# Patient Record
Sex: Female | Born: 1990 | Race: White | Hispanic: No | Marital: Married | Smoking: Current every day smoker
Health system: Southern US, Community
[De-identification: ages and names within clinical notes are randomized; demographics above are authoritative.]

## PROBLEM LIST (undated history)

## (undated) DIAGNOSIS — J189 Pneumonia, unspecified organism: Secondary | ICD-10-CM

## (undated) DIAGNOSIS — R011 Cardiac murmur, unspecified: Secondary | ICD-10-CM

## (undated) DIAGNOSIS — D649 Anemia, unspecified: Secondary | ICD-10-CM

## (undated) HISTORY — PX: HAND SURGERY: SHX662

---
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
## 2014-01-19 ENCOUNTER — Emergency Department (HOSPITAL_COMMUNITY)
Admission: EM | Admit: 2014-01-19 | Discharge: 2014-01-19 | Disposition: A | Discharge status: Home / Self Care | Payer: Self-pay | Attending: Emergency Medicine | Admitting: Emergency Medicine

## 2014-01-19 ENCOUNTER — Encounter (HOSPITAL_COMMUNITY): Payer: Self-pay | Admitting: Emergency Medicine

## 2014-01-19 ENCOUNTER — Emergency Department (HOSPITAL_COMMUNITY): Payer: Self-pay

## 2014-01-19 DIAGNOSIS — X58XXXA Exposure to other specified factors, initial encounter: Secondary | ICD-10-CM | POA: Insufficient documentation

## 2014-01-19 DIAGNOSIS — S92919A Unspecified fracture of unspecified toe(s), initial encounter for closed fracture: Secondary | ICD-10-CM | POA: Insufficient documentation

## 2014-01-19 DIAGNOSIS — Y92838 Other recreation area as the place of occurrence of the external cause: Secondary | ICD-10-CM

## 2014-01-19 DIAGNOSIS — Y9239 Other specified sports and athletic area as the place of occurrence of the external cause: Secondary | ICD-10-CM | POA: Insufficient documentation

## 2014-01-19 DIAGNOSIS — S92401A Displaced unspecified fracture of right great toe, initial encounter for closed fracture: Secondary | ICD-10-CM

## 2014-01-19 DIAGNOSIS — Y9367 Activity, basketball: Secondary | ICD-10-CM | POA: Insufficient documentation

## 2014-01-19 MED ORDER — IBUPROFEN 800 MG PO TABS: 800.0000 mg | ORAL_TABLET | Freq: Three times a day (TID) | ORAL | Status: DC

## 2014-01-19 MED ORDER — HYDROCODONE-ACETAMINOPHEN 5-325 MG PO TABS
1.0000 | ORAL_TABLET | Freq: Once | ORAL | Status: AC
  Administered 2014-01-19: 1 via ORAL
  Filled 2014-01-19: qty 1

## 2014-01-19 MED ORDER — HYDROCODONE-ACETAMINOPHEN 5-325 MG PO TABS: 1.0000 | ORAL_TABLET | ORAL | Status: DC | PRN

## 2014-01-19 NOTE — ED Notes (Signed)
Rt great toe bruised and swollen after  Playing basketball yesterday  Has good pulse to foot and can wiggle toes

## 2014-01-19 NOTE — Discharge Instructions (Signed)
Take the prescribed medication as directed for pain.  Wear post-op shoe and use crutches as directed in ED. Elevate and ice toe when at home to help with pain and swelling. Follow-up with orthopedics for re-check. Return to the ED for new or worsening symptoms.

## 2014-01-19 NOTE — ED Notes (Signed)
Last night pt was playing basketball and came down on her R great toe wrong. To swollen and discolored.

## 2014-01-19 NOTE — ED Notes (Signed)
Toe big rt buddy taped and pt given crutches w/ instructions in use pt and family states understanding

## 2014-01-19 NOTE — ED Provider Notes (Signed)
CSN: [id number]     Arrival date & time 01/19/14  1128 History  This chart was scribed for Sharilyn Sites, PA working with Raeford Razor, MD by Quintella Reichert, ED Scribe. This patient was seen in room TR06C/TR06C and the patient's care was started at 1:26 PM.   Chief Complaint  Patient presents with  . Foot Injury    The history is provided by the patient. No language interpreter was used.    HPI Comments: KOURTNIE SACHS is a 23 y.o. female who presents to the Emergency Department complaining of a right foot injury sustained last night.  Pt reports she was playing basketball and came down on her right great toe wrong.  No head trauma or LOC.  Since then she has had constant, moderate-to-severe pain to the right great toe that is worsened by bearing weight.  She denies numbness or paresthesias.  Pt admits to prior h/o fracture to that toe-- was seen at [facility] for that but is no longer an established pt.  Has taken OTC aleve without noted improvement.   History reviewed. No pertinent past medical history.   Past Surgical History  Procedure Laterality Date  . Hand surgery      No family history on file.   History  Substance Use Topics  . Smoking status: Not on file  . Smokeless tobacco: Not on file  . Alcohol Use: Not on file    OB History   Grav Para Term Preterm Abortions TAB SAB Ect Mult Living                   Review of Systems  Musculoskeletal: Positive for arthralgias (right great toe).  Neurological: Negative for numbness.  All other systems reviewed and are negative.      Allergies  Sulfa antibiotics  Home Medications  No current outpatient prescriptions on file.  BP 155/94  Pulse 113  Temp(Src) 97.6 F (36.4 C) (Oral)  Resp 16  Ht 5\' 6"  (1.676 m)  Wt 125 lb (56.7 kg)  BMI 20.19 kg/m2  SpO2 96%  LMP 01/08/2014  Physical Exam  Nursing note and vitals reviewed. Constitutional: She is oriented to person, place, and time. She appears  well-developed and well-nourished. No distress.  HENT:  Head: Normocephalic and atraumatic.  Mouth/Throat: Oropharynx is clear and moist.  Eyes: Conjunctivae and EOM are normal. Pupils are equal, round, and reactive to light.  Neck: Normal range of motion. Neck supple.  Cardiovascular: Normal rate, regular rhythm and normal heart sounds.   Pulmonary/Chest: Effort normal and breath sounds normal. No respiratory distress. She has no wheezes.  Musculoskeletal:       Right ankle: She exhibits swelling and ecchymosis. She exhibits normal range of motion and no deformity. Tenderness.       Feet:  Right great toe swollen and bruised at DIP joint.  Toenail intact.  Limited flexion and extension due to pain.  Strong distal pulse.  Sensation intact.  Neurological: She is alert and oriented to person, place, and time.  Skin: Skin is warm and dry. She is not diaphoretic.  Psychiatric: She has a normal mood and affect.    ED Course  Procedures (including critical care time)  DIAGNOSTIC STUDIES: Oxygen Saturation is 96% on room air, normal by my interpretation.    COORDINATION OF CARE: 1:30 PM-Informed pt that x-ray reveals a fracture.  Discussed treatment plan which includes pain medication, post-op boot, crutches, and orthopedic f/u with pt at bedside and pt  agreed to plan.     Labs Review Labs Reviewed - No data to display  Imaging Review Dg Toe Great Right  01/19/2014   CLINICAL DATA:  Basketball injury of the great toe with soft tissue swelling and bruising.  EXAM: RIGHT GREAT TOE  COMPARISON:  None.  FINDINGS: Bifida medial sesamoid, well corticated.  Acute fracture of the dorsal articular surface of the distal phalanx of the great toe is appreciable on the lateral projection.  IMPRESSION: 1. Fracture of the dorsal base of the distal phalanx of the great toe, extending into the articular surface.   Electronically Signed   By: Herbie BaltimoreWalt  Liebkemann M.D.   On: 01/19/2014 13:10    EKG  Interpretation   None       MDM   Final diagnoses:  Fracture of right great toe   X-ray revealing fracture of her right great toe. Foot NVI, no sensory deficit.  Toes were buddy taped, placed in postop shoe, crutches given. Patient will followup with orthopedics in 1- 2 weeks for recheck. Rx Vicodin and Motrin. Discussed plan with patient, she nods understanding and agreed with plan of care. Return precautions advised.   I personally performed the services described in this documentation, which was scribed in my presence. The recorded information has been reviewed and is accurate.  Garlon HatchetLisa M Sanders, PA-C 01/19/14 1352

## 2014-01-23 NOTE — ED Provider Notes (Signed)
Medical screening examination/treatment/procedure(s) were performed by non-physician practitioner and as supervising physician I was immediately available for consultation/collaboration.  EKG Interpretation   None        Dennison Mcdaid, MD 01/23/14 1415 

## 2017-12-08 NOTE — L&D Delivery Note (Signed)
Delivery Note At  a viable female was delivered via  (Presentation:ROA ; Vtx ).  APGAR: , ; weight  .   Placenta status:complete , .3V  Cord:CAN x1 loose  with the following complications:thick mec NICU in to transition baby. .    Anesthesia:  Epidural Episiotomy:  None Lacerations:  2nd Suture Repair: 2.0 vicryl Est. Blood Loss (mL):  75cc  Mom to postpartum.  Baby to Couplet care / Skin to Skin .  Teresa Scott 03/10/2018, 1:15 AM

## 2018-01-20 LAB — OB RESULTS CONSOLE ABO/RH: RH TYPE: POSITIVE

## 2018-01-20 LAB — OB RESULTS CONSOLE GC/CHLAMYDIA
CHLAMYDIA, DNA PROBE: NEGATIVE
GC PROBE AMP, GENITAL: NEGATIVE

## 2018-01-20 LAB — OB RESULTS CONSOLE RUBELLA ANTIBODY, IGM: RUBELLA: IMMUNE

## 2018-01-20 LAB — OB RESULTS CONSOLE ANTIBODY SCREEN: Antibody Screen: NEGATIVE

## 2018-01-20 LAB — OB RESULTS CONSOLE RPR: RPR: NONREACTIVE

## 2018-01-20 LAB — OB RESULTS CONSOLE HEPATITIS B SURFACE ANTIGEN: HEP B S AG: NEGATIVE

## 2018-01-20 LAB — OB RESULTS CONSOLE HIV ANTIBODY (ROUTINE TESTING): HIV: NONREACTIVE

## 2018-02-18 LAB — OB RESULTS CONSOLE GBS: GBS: NEGATIVE

## 2018-03-09 ENCOUNTER — Encounter (HOSPITAL_COMMUNITY): Payer: Self-pay

## 2018-03-09 ENCOUNTER — Inpatient Hospital Stay (HOSPITAL_COMMUNITY)
Admission: AD | Admit: 2018-03-09 | Discharge: 2018-03-12 | DRG: 807 | Disposition: A | Discharge status: Home / Self Care | Payer: Commercial Managed Care - PPO | Source: Ambulatory Visit | Attending: Obstetrics and Gynecology | Admitting: Obstetrics and Gynecology

## 2018-03-09 ENCOUNTER — Inpatient Hospital Stay (HOSPITAL_COMMUNITY): Payer: Commercial Managed Care - PPO | Admitting: Anesthesiology

## 2018-03-09 ENCOUNTER — Other Ambulatory Visit: Payer: Self-pay

## 2018-03-09 DIAGNOSIS — Z3A38 38 weeks gestation of pregnancy: Secondary | ICD-10-CM | POA: Diagnosis not present

## 2018-03-09 DIAGNOSIS — O99334 Smoking (tobacco) complicating childbirth: Secondary | ICD-10-CM | POA: Diagnosis present

## 2018-03-09 DIAGNOSIS — O99824 Streptococcus B carrier state complicating childbirth: Secondary | ICD-10-CM | POA: Diagnosis present

## 2018-03-09 DIAGNOSIS — F17210 Nicotine dependence, cigarettes, uncomplicated: Secondary | ICD-10-CM | POA: Diagnosis present

## 2018-03-09 DIAGNOSIS — Z3490 Encounter for supervision of normal pregnancy, unspecified, unspecified trimester: Secondary | ICD-10-CM

## 2018-03-09 DIAGNOSIS — O134 Gestational [pregnancy-induced] hypertension without significant proteinuria, complicating childbirth: Secondary | ICD-10-CM | POA: Diagnosis present

## 2018-03-09 DIAGNOSIS — D72829 Elevated white blood cell count, unspecified: Secondary | ICD-10-CM | POA: Diagnosis not present

## 2018-03-09 DIAGNOSIS — J302 Other seasonal allergic rhinitis: Secondary | ICD-10-CM | POA: Diagnosis present

## 2018-03-09 HISTORY — DX: Anemia, unspecified: D64.9

## 2018-03-09 HISTORY — DX: Pneumonia, unspecified organism: J18.9

## 2018-03-09 HISTORY — DX: Cardiac murmur, unspecified: R01.1

## 2018-03-09 LAB — CBC
HCT: 37.3 % (ref 36.0–46.0)
Hemoglobin: 12.8 g/dL (ref 12.0–15.0)
MCH: 30 pg (ref 26.0–34.0)
MCHC: 34.3 g/dL (ref 30.0–36.0)
MCV: 87.6 fL (ref 78.0–100.0)
Platelets: 275 10*3/uL (ref 150–400)
RBC: 4.26 MIL/uL (ref 3.87–5.11)
RDW: 14.2 % (ref 11.5–15.5)
WBC: 14.5 10*3/uL — ABNORMAL HIGH (ref 4.0–10.5)

## 2018-03-09 LAB — URINALYSIS, ROUTINE W REFLEX MICROSCOPIC
BILIRUBIN URINE: NEGATIVE
Glucose, UA: NEGATIVE mg/dL
HGB URINE DIPSTICK: NEGATIVE
Ketones, ur: NEGATIVE mg/dL
Leukocytes, UA: NEGATIVE
Nitrite: NEGATIVE
PH: 7 (ref 5.0–8.0)
Protein, ur: NEGATIVE mg/dL
SPECIFIC GRAVITY, URINE: 1.012 (ref 1.005–1.030)

## 2018-03-09 LAB — COMPREHENSIVE METABOLIC PANEL
ALT: 16 U/L (ref 14–54)
AST: 20 U/L (ref 15–41)
Albumin: 3.4 g/dL — ABNORMAL LOW (ref 3.5–5.0)
Alkaline Phosphatase: 207 U/L — ABNORMAL HIGH (ref 38–126)
Anion gap: 13 (ref 5–15)
BUN: 6 mg/dL (ref 6–20)
CO2: 18 mmol/L — AB (ref 22–32)
CREATININE: 0.53 mg/dL (ref 0.44–1.00)
Calcium: 9 mg/dL (ref 8.9–10.3)
Chloride: 102 mmol/L (ref 101–111)
Glucose, Bld: 99 mg/dL (ref 65–99)
Potassium: 3.7 mmol/L (ref 3.5–5.1)
Sodium: 133 mmol/L — ABNORMAL LOW (ref 135–145)
Total Bilirubin: 1.1 mg/dL (ref 0.3–1.2)
Total Protein: 8 g/dL (ref 6.5–8.1)

## 2018-03-09 LAB — PROTEIN / CREATININE RATIO, URINE
Creatinine, Urine: 148 mg/dL
Protein Creatinine Ratio: 0.12 mg/mg{Cre} (ref 0.00–0.15)
Total Protein, Urine: 18 mg/dL

## 2018-03-09 LAB — TYPE AND SCREEN
ABO/RH(D): A POS
Antibody Screen: NEGATIVE

## 2018-03-09 LAB — ABO/RH: ABO/RH(D): A POS

## 2018-03-09 MED ORDER — TERBUTALINE SULFATE 1 MG/ML IJ SOLN
0.2500 mg | Freq: Once | INTRAMUSCULAR | Status: DC | PRN
  Filled 2018-03-09: qty 1

## 2018-03-09 MED ORDER — LACTATED RINGERS IV SOLN: 500.0000 mL | Freq: Once | INTRAVENOUS | Status: DC

## 2018-03-09 MED ORDER — ONDANSETRON HCL 4 MG/2ML IJ SOLN
4.0000 mg | Freq: Four times a day (QID) | INTRAMUSCULAR | Status: DC | PRN
  Administered 2018-03-09: 4 mg via INTRAVENOUS
  Filled 2018-03-09: qty 2

## 2018-03-09 MED ORDER — OXYTOCIN BOLUS FROM INFUSION
500.0000 mL | Freq: Once | INTRAVENOUS | Status: AC
  Administered 2018-03-10: 500 mL via INTRAVENOUS

## 2018-03-09 MED ORDER — FENTANYL 2.5 MCG/ML BUPIVACAINE 1/10 % EPIDURAL INFUSION (WH - ANES)
14.0000 mL/h | INTRAMUSCULAR | Status: DC | PRN
  Administered 2018-03-09: 14 mL/h via EPIDURAL
  Administered 2018-03-09: 12 mL/h via EPIDURAL
  Filled 2018-03-09 (×2): qty 100

## 2018-03-09 MED ORDER — ACETAMINOPHEN 325 MG PO TABS: 650.0000 mg | ORAL_TABLET | ORAL | Status: DC | PRN

## 2018-03-09 MED ORDER — SODIUM CHLORIDE 0.9 % IV SOLN
5.0000 10*6.[IU] | Freq: Once | INTRAVENOUS | Status: AC
  Administered 2018-03-09: 5 10*6.[IU] via INTRAVENOUS
  Filled 2018-03-09: qty 5

## 2018-03-09 MED ORDER — FLEET ENEMA 7-19 GM/118ML RE ENEM: 1.0000 | ENEMA | RECTAL | Status: DC | PRN

## 2018-03-09 MED ORDER — LIDOCAINE HCL (PF) 1 % IJ SOLN
30.0000 mL | INTRAMUSCULAR | Status: DC | PRN
  Filled 2018-03-09: qty 30

## 2018-03-09 MED ORDER — SOD CITRATE-CITRIC ACID 500-334 MG/5ML PO SOLN: 30.0000 mL | ORAL | Status: DC | PRN

## 2018-03-09 MED ORDER — OXYCODONE-ACETAMINOPHEN 5-325 MG PO TABS: 2.0000 | ORAL_TABLET | ORAL | Status: DC | PRN

## 2018-03-09 MED ORDER — MISOPROSTOL 25 MCG QUARTER TABLET
25.0000 ug | ORAL_TABLET | ORAL | Status: DC | PRN
  Filled 2018-03-09: qty 1

## 2018-03-09 MED ORDER — PHENYLEPHRINE 40 MCG/ML (10ML) SYRINGE FOR IV PUSH (FOR BLOOD PRESSURE SUPPORT)
80.0000 ug | PREFILLED_SYRINGE | INTRAVENOUS | Status: DC | PRN
  Filled 2018-03-09: qty 5
  Filled 2018-03-09: qty 10

## 2018-03-09 MED ORDER — DIPHENHYDRAMINE HCL 50 MG/ML IJ SOLN: 12.5000 mg | INTRAMUSCULAR | Status: DC | PRN

## 2018-03-09 MED ORDER — EPHEDRINE 5 MG/ML INJ
10.0000 mg | INTRAVENOUS | Status: DC | PRN
  Filled 2018-03-09: qty 2

## 2018-03-09 MED ORDER — LACTATED RINGERS IV SOLN
INTRAVENOUS | Status: DC
  Administered 2018-03-09 (×2): via INTRAVENOUS

## 2018-03-09 MED ORDER — OXYCODONE-ACETAMINOPHEN 5-325 MG PO TABS: 1.0000 | ORAL_TABLET | ORAL | Status: DC | PRN

## 2018-03-09 MED ORDER — PENICILLIN G POT IN DEXTROSE 60000 UNIT/ML IV SOLN
3.0000 10*6.[IU] | INTRAVENOUS | Status: DC
  Filled 2018-03-09 (×6): qty 50

## 2018-03-09 MED ORDER — OXYTOCIN 40 UNITS IN LACTATED RINGERS INFUSION - SIMPLE MED
2.5000 [IU]/h | INTRAVENOUS | Status: DC
  Filled 2018-03-09: qty 1000

## 2018-03-09 MED ORDER — PHENYLEPHRINE 40 MCG/ML (10ML) SYRINGE FOR IV PUSH (FOR BLOOD PRESSURE SUPPORT)
80.0000 ug | PREFILLED_SYRINGE | INTRAVENOUS | Status: DC | PRN
  Filled 2018-03-09: qty 5

## 2018-03-09 MED ORDER — LIDOCAINE HCL (PF) 1 % IJ SOLN
INTRAMUSCULAR | Status: DC | PRN
  Administered 2018-03-09 (×2): 4 mL via EPIDURAL

## 2018-03-09 MED ORDER — LACTATED RINGERS IV SOLN
500.0000 mL | Freq: Once | INTRAVENOUS | Status: AC
  Administered 2018-03-09: 500 mL via INTRAVENOUS

## 2018-03-09 MED ORDER — LACTATED RINGERS IV SOLN: 500.0000 mL | INTRAVENOUS | Status: DC | PRN

## 2018-03-09 NOTE — MAU Note (Signed)
Pt states she lost her mucous plug this am accompanied with mild cramps. States last night she began having dull lower back pain.  States "pinkish" vaginal discharge. States +FM.

## 2018-03-09 NOTE — Progress Notes (Signed)
   03/09/18 0718 03/09/18 0724 03/09/18 0738  Vital Signs  BP  --  (!) 146/96 136/90  Pulse Rate  --  91 69  Labor Management  Labor Response  --   --  Unaware of contractions;Talking/texting through contractions  Fetal Heart Rate A  Mode  --   --  External  Baseline Rate (A)  --   --  140 bpm  Variability  --   --  6-25 BPM  Accelerations  --   --  15 x 15  Decelerations  --   --  None  Uterine Activity  Mode  --   --  Toco  Contraction Frequency (min)  --   --  1.5  Contraction Duration (sec)  --   --  40-70  Contraction Quality  --   --  Mild  Resting Tone Palpated  --   --  Relaxed  Cervical Exam  Dilation 1  --   --   Effacement (%) 60  --   --   Cervical Position Anterior  --   --   Cervical Consistency Medium  --   --   Vag. Bleeding None  --   --   Station -2  --   --   Presentation Vertex  --   --   Exam by: Claudette LawsJaton Brinley Treanor rnc  --   --   Dr Normand Sloopillard called & report given on above assessment to include pt asymptomatic for PreE.  Orders rec'd to continue serial Bps & labwork (see orders).  Pt informed.

## 2018-03-09 NOTE — H&P (Signed)
Teresa Scott is a 27 y.o. female presenting for LABOR. OB History    Gravida  1   Para      Term      Preterm      AB      Living        SAB      TAB      Ectopic      Multiple      Live Births             Past Medical History:  Diagnosis Date  . Anemia   . Heart murmur   . Pneumonia    "couple years ago"   Past Surgical History:  Procedure Laterality Date  . HAND SURGERY     Family History: family history is not on file. Social History:  reports that she has been smoking.  She has been smoking about 0.25 packs per day. She has never used smokeless tobacco. She reports that she drank alcohol. She reports that she has current or past drug history. Drug: Marijuana.     Maternal Diabetes: No Genetic Screening: Normal Maternal Ultrasounds/Referrals: Normal Fetal Ultrasounds or other Referrals:  None Maternal Substance Abuse:  No Significant Maternal Medications:  None Significant Maternal Lab Results:  None Other Comments:  None  ROS History Dilation: 4.5 Effacement (%): 90 Station: -1 Exam by:: Tennis MustS. Russell, RN  Blood pressure 122/68, pulse 78, temperature 98.5 F (36.9 C), temperature source Oral, resp. rate 18, height 5\' 5"  (1.651 m), weight 65.8 kg (145 lb), SpO2 98 %. Exam Physical Exam  Prenatal labs: ABO, Rh: --/--/A POS, A POS Performed at [facility], [address] [place], [place] [address]  [id number](04/02 0819) Antibody: NEG (04/02 [id number]) Rubella: Immune (02/13 0000) RPR: Nonreactive (02/13 0000)  HBsAg: Negative (02/13 0000)  HIV: Non-reactive (02/13 0000)  GBS: Negative (03/14 0000)  Physical Examination: General appearance - alert, well appearing, and in no distress Mental status - alert, oriented to person, place, and time Chest - clear to auscultation, no wheezes, rales or rhonchi, symmetric air entry Heart - normal rate and regular rhythm Abdomen - soft, nontender, nondistended, no masses or organomegaly Extremities - Homan's  sign negative bilaterally  Assessment/Plan: ACTIVE LABOR GBS POS START PCNG GESTATIONAL HTN.  LABS WNL.  MONITOR BP ANTICIPATE SVD   Nelwyn Hebdon A 03/09/2018, 6:19 PM

## 2018-03-09 NOTE — Anesthesia Preprocedure Evaluation (Signed)
Anesthesia Evaluation  Patient identified by MRN, date of birth, ID band Patient awake    Reviewed: Allergy & Precautions, Patient's Chart, lab work & pertinent test results  Airway Mallampati: II  TM Distance: >3 FB Neck ROM: Full    Dental no notable dental hx. (+) Teeth Intact   Pulmonary pneumonia, resolved, Current Smoker,    breath sounds clear to auscultation       Cardiovascular Normal cardiovascular exam+ Valvular Problems/Murmurs  Rhythm:Regular Rate:Normal     Neuro/Psych negative neurological ROS  negative psych ROS   GI/Hepatic negative GI ROS,   Endo/Other  negative endocrine ROS  Renal/GU   negative genitourinary   Musculoskeletal negative musculoskeletal ROS (+)   Abdominal   Peds  Hematology  (+) anemia ,   Anesthesia Other Findings   Reproductive/Obstetrics (+) Pregnancy                             Anesthesia Physical Anesthesia Plan  ASA: II  Anesthesia Plan: Epidural   Post-op Pain Management:    Induction:   PONV Risk Score and Plan:   Airway Management Planned: Natural Airway  Additional Equipment:   Intra-op Plan:   Post-operative Plan:   Informed Consent: I have reviewed the patients History and Physical, chart, labs and discussed the procedure including the risks, benefits and alternatives for the proposed anesthesia with the patient or authorized representative who has indicated his/her understanding and acceptance.     Plan Discussed with: Anesthesiologist  Anesthesia Plan Comments:         Anesthesia Quick Evaluation

## 2018-03-09 NOTE — Anesthesia Procedure Notes (Signed)
Epidural Patient location during procedure: OB Start time: 03/09/2018 1:52 PM  Staffing Anesthesiologist: Mal AmabileFoster, Jacobi Nile, MD Performed: anesthesiologist   Preanesthetic Checklist Completed: patient identified, site marked, surgical consent, pre-op evaluation, timeout performed, IV checked, risks and benefits discussed and monitors and equipment checked  Epidural Patient position: sitting Prep: site prepped and draped and DuraPrep Patient monitoring: continuous pulse ox and blood pressure Approach: midline Location: L3-L4 Injection technique: LOR air  Needle:  Needle type: Tuohy  Needle gauge: 17 G Needle length: 9 cm and 9 Needle insertion depth: 5 cm cm Catheter type: closed end flexible Catheter size: 19 Gauge Catheter at skin depth: 10 cm Test dose: negative and Other  Assessment Events: blood not aspirated, injection not painful, no injection resistance, negative IV test and no paresthesia  Additional Notes Patient identified. Risks and benefits discussed including failed block, incomplete  Pain control, post dural puncture headache, nerve damage, paralysis, blood pressure Changes, nausea, vomiting, reactions to medications-both toxic and allergic and post Partum back pain. All questions were answered. Patient expressed understanding and wished to proceed. Sterile technique was used throughout procedure. Epidural site was Dressed with sterile barrier dressing. No paresthesias, signs of intravascular injection Or signs of intrathecal spread were encountered.  Patient was more comfortable after the epidural was dosed. Please see RN's note for documentation of vital signs and FHR which are stable.

## 2018-03-09 NOTE — Progress Notes (Signed)
Subjective: Comfortable with epidural.  RN called stating SROM thick mec fluid  Objective: BP 135/85   Pulse 71   Temp 98.9 F (37.2 C) (Oral)   Resp 18   Ht 5\' 5"  (1.651 m)   Wt 65.8 kg (145 lb)   SpO2 98%   BMI 24.13 kg/m  No intake/output data recorded. No intake/output data recorded.  FHT: Category 1  FHT 125 accels no decels UC:   regular, every 3 minutes SVE:   Dilation: 5 Effacement (%): 100 Station: -1 Exam by:: Lucas MallowKlashley, RNC  Assessment:  G1P0 at 38 weeks in early active labor with SROM thick mec Cat 1 strip  Plan: Monitor progress NICU at delivery  Kenney HousemanNancy Jean Prothero CNM, MSN 03/09/2018, 9:26 PM

## 2018-03-10 ENCOUNTER — Encounter (HOSPITAL_COMMUNITY): Payer: Self-pay

## 2018-03-10 LAB — RPR: RPR Ser Ql: NONREACTIVE

## 2018-03-10 LAB — CBC
HCT: 33.9 % — ABNORMAL LOW (ref 36.0–46.0)
HEMOGLOBIN: 11.6 g/dL — AB (ref 12.0–15.0)
MCH: 30 pg (ref 26.0–34.0)
MCHC: 34.2 g/dL (ref 30.0–36.0)
MCV: 87.6 fL (ref 78.0–100.0)
Platelets: 214 10*3/uL (ref 150–400)
RBC: 3.87 MIL/uL (ref 3.87–5.11)
RDW: 14.2 % (ref 11.5–15.5)
WBC: 23.8 10*3/uL — ABNORMAL HIGH (ref 4.0–10.5)

## 2018-03-10 MED ORDER — COCONUT OIL OIL: 1.0000 "application " | TOPICAL_OIL | Status: DC | PRN

## 2018-03-10 MED ORDER — SIMETHICONE 80 MG PO CHEW: 80.0000 mg | CHEWABLE_TABLET | ORAL | Status: DC | PRN

## 2018-03-10 MED ORDER — ZOLPIDEM TARTRATE 5 MG PO TABS: 5.0000 mg | ORAL_TABLET | Freq: Every evening | ORAL | Status: DC | PRN

## 2018-03-10 MED ORDER — BENZOCAINE-MENTHOL 20-0.5 % EX AERO
1.0000 "application " | INHALATION_SPRAY | CUTANEOUS | Status: DC | PRN
  Administered 2018-03-10: 1 via TOPICAL
  Filled 2018-03-10: qty 56

## 2018-03-10 MED ORDER — DIPHENHYDRAMINE HCL 25 MG PO CAPS: 25.0000 mg | ORAL_CAPSULE | Freq: Four times a day (QID) | ORAL | Status: DC | PRN

## 2018-03-10 MED ORDER — ACETAMINOPHEN 325 MG PO TABS
650.0000 mg | ORAL_TABLET | ORAL | Status: DC | PRN
  Administered 2018-03-10: 650 mg via ORAL
  Filled 2018-03-10: qty 2

## 2018-03-10 MED ORDER — IBUPROFEN 600 MG PO TABS
600.0000 mg | ORAL_TABLET | Freq: Four times a day (QID) | ORAL | Status: DC
  Administered 2018-03-10 – 2018-03-12 (×9): 600 mg via ORAL
  Filled 2018-03-10 (×10): qty 1

## 2018-03-10 MED ORDER — SENNOSIDES-DOCUSATE SODIUM 8.6-50 MG PO TABS
2.0000 | ORAL_TABLET | ORAL | Status: DC
  Administered 2018-03-11: 2 via ORAL
  Filled 2018-03-10 (×2): qty 2

## 2018-03-10 MED ORDER — WITCH HAZEL-GLYCERIN EX PADS: 1.0000 "application " | MEDICATED_PAD | CUTANEOUS | Status: DC | PRN

## 2018-03-10 MED ORDER — DIBUCAINE 1 % RE OINT
1.0000 "application " | TOPICAL_OINTMENT | RECTAL | Status: DC | PRN
  Administered 2018-03-10: 1 via RECTAL
  Filled 2018-03-10: qty 28

## 2018-03-10 MED ORDER — ONDANSETRON HCL 4 MG PO TABS: 4.0000 mg | ORAL_TABLET | ORAL | Status: DC | PRN

## 2018-03-10 MED ORDER — OXYCODONE-ACETAMINOPHEN 5-325 MG PO TABS
1.0000 | ORAL_TABLET | ORAL | Status: DC | PRN
  Administered 2018-03-10 (×2): 2 via ORAL
  Administered 2018-03-10 – 2018-03-11 (×2): 1 via ORAL
  Administered 2018-03-11 (×3): 2 via ORAL
  Administered 2018-03-11: 1 via ORAL
  Administered 2018-03-12: 2 via ORAL
  Administered 2018-03-12: 1 via ORAL
  Filled 2018-03-10 (×2): qty 2
  Filled 2018-03-10: qty 1
  Filled 2018-03-10 (×4): qty 2
  Filled 2018-03-10 (×3): qty 1

## 2018-03-10 MED ORDER — NICOTINE 21 MG/24HR TD PT24
21.0000 mg | MEDICATED_PATCH | Freq: Every day | TRANSDERMAL | Status: DC
  Administered 2018-03-10 – 2018-03-12 (×3): 21 mg via TRANSDERMAL
  Filled 2018-03-10 (×4): qty 1

## 2018-03-10 MED ORDER — ONDANSETRON HCL 4 MG/2ML IJ SOLN: 4.0000 mg | INTRAMUSCULAR | Status: DC | PRN

## 2018-03-10 MED ORDER — PRENATAL MULTIVITAMIN CH
1.0000 | ORAL_TABLET | Freq: Every day | ORAL | Status: DC
  Administered 2018-03-10 – 2018-03-12 (×3): 1 via ORAL
  Filled 2018-03-10 (×3): qty 1

## 2018-03-10 MED ORDER — TETANUS-DIPHTH-ACELL PERTUSSIS 5-2.5-18.5 LF-MCG/0.5 IM SUSP: 0.5000 mL | Freq: Once | INTRAMUSCULAR | Status: DC

## 2018-03-10 NOTE — Progress Notes (Signed)
Subjective: Postpartum Day 0: Vaginal delivery, 2nd degree laceration Patient up ad lib, reports no syncope or dizziness. Feeding: Bottle Contraceptive plan:  Undecided  Baby in NICU with likely TTN.    Initial BPs in labor were elevated, never met criteria for treatment. Patient denies HA, visual sx, or epigastric pain.  Objective: Vital signs in last 24 hours: Temp:  [97.7 F (36.5 C)-98.9 F (37.2 C)] 98.4 F (36.9 C) (04/03 0434) Pulse Rate:  [61-114] 78 (04/03 0434) Resp:  [16-18] 18 (04/03 0434) BP: (106-158)/(52-121) 120/70 (04/03 0434)   Vitals:   03/10/18 0216 03/10/18 0231 03/10/18 0342 03/10/18 0434  BP: 133/60 130/65 128/68 120/70  Pulse: 75 79 68 78  Resp:   18 18  Temp:   98.6 F (37 C) 98.4 F (36.9 C)  TempSrc:   Oral Oral  SpO2:      Weight:      Height:       BP normal since delivery, PIH labs and PCR negative yesterday.  Physical Exam:  General: alert Lochia: appropriate Uterine Fundus: firm Perineum: healing well DVT Evaluation: No evidence of DVT seen on physical exam. Negative Homan's sign.   CBC Latest Ref Rng & Units 03/10/2018 03/09/2018  WBC 4.0 - 10.5 K/uL 23.8(H) 14.5(H)  Hemoglobin 12.0 - 15.0 g/dL 11.6(L) 12.8  Hematocrit 36.0 - 46.0 % 33.9(L) 37.3  Platelets 150 - 400 K/uL 214 275     Assessment/Plan: Status post vaginal delivery day 0. NICU infant Leukocytosis Stable Continue current care. Repeat CBC with diff tomorrow.    Bubba Camp 03/10/2018, 7:44 AM

## 2018-03-10 NOTE — Consult Note (Signed)
[facility]  Delivery Note:  SVD    03/10/2018  1:12 AM  I was called to the delivery room at the request of the patient's CNM Bernerd Pho(Nancy Prothero) for thick meconium.  PRENATAL HX:  This is a 27 y/o G1P0 at 3038 and 1/[redacted] weeks gestation who was admitted last night in spontaneous labor.  Her pregnancy has been complicated by late prenatal care and tobacco use.  She is GBS positive and received adequate treatment.  SROM x2 hours.    DELIVERY:  Infant was vigorous at delivery, initially requiring no resuscitation other than standard warming, drying and stimulation.  She continued to have central cyanosis at 5 minutes of age.   A pulse oximeter was applied and O2 saturations in 70s.  Blow by O2 was given and O2 saturations rose to 90s, however the continued to fall to low 80s without blow by O2 by 10 minutes of age.  Blow by oxygen continued on and off through 25 minutes, but O2 saturations continued to fall to mid 80s in RA.   APGARs 8 and 8.  Exam notable for mild tachypnea, otherwise was within normal limits.  Infant likely with TTN.  Will admit to NICU for observation as a transition infant.    _____________________ Electronically Signed By: Maryan CharLindsey Sherita Decoste, MD Neonatologist

## 2018-03-10 NOTE — Progress Notes (Signed)
Patient taken to NICU to visit with baby then will be taken to mother baby room

## 2018-03-10 NOTE — Progress Notes (Signed)
Patient brought to mother baby room at this time

## 2018-03-10 NOTE — Anesthesia Postprocedure Evaluation (Signed)
Anesthesia Post Note  Patient: Teresa Scott  Procedure(s) Performed: AN AD HOC LABOR EPIDURAL     Patient location during evaluation: Mother Baby Anesthesia Type: Epidural Level of consciousness: awake Pain management: satisfactory to patient Vital Signs Assessment: post-procedure vital signs reviewed and stable Respiratory status: spontaneous breathing Cardiovascular status: stable Anesthetic complications: no    Last Vitals:  Vitals:   03/10/18 0434 03/10/18 0935  BP: 120/70 138/81  Pulse: 78 67  Resp: 18 18  Temp: 36.9 C 36.8 C  SpO2:      Last Pain:  Vitals:   03/10/18 1335  TempSrc:   PainSc: 2    Pain Goal:                 KeyCorpBURGER,Layan Zalenski

## 2018-03-11 LAB — CBC WITH DIFFERENTIAL/PLATELET
BASOS ABS: 0 10*3/uL (ref 0.0–0.1)
Basophils Relative: 0 %
Eosinophils Absolute: 0.1 10*3/uL (ref 0.0–0.7)
Eosinophils Relative: 1 %
HEMATOCRIT: 30.2 % — AB (ref 36.0–46.0)
HEMOGLOBIN: 10.2 g/dL — AB (ref 12.0–15.0)
LYMPHS ABS: 3.4 10*3/uL (ref 0.7–4.0)
LYMPHS PCT: 30 %
MCH: 29.7 pg (ref 26.0–34.0)
MCHC: 33.8 g/dL (ref 30.0–36.0)
MCV: 88 fL (ref 78.0–100.0)
Monocytes Absolute: 0.3 10*3/uL (ref 0.1–1.0)
Monocytes Relative: 3 %
NEUTROS ABS: 7.6 10*3/uL (ref 1.7–7.7)
NEUTROS PCT: 66 %
PLATELETS: 194 10*3/uL (ref 150–400)
RBC: 3.43 MIL/uL — AB (ref 3.87–5.11)
RDW: 14.3 % (ref 11.5–15.5)
WBC: 11.4 10*3/uL — AB (ref 4.0–10.5)

## 2018-03-11 MED ORDER — GUAIFENESIN-DM 100-10 MG/5ML PO SYRP
5.0000 mL | ORAL_SOLUTION | ORAL | Status: DC | PRN
  Administered 2018-03-11: 5 mL via ORAL
  Filled 2018-03-11 (×2): qty 5

## 2018-03-11 MED ORDER — LORATADINE 10 MG PO TABS
10.0000 mg | ORAL_TABLET | Freq: Every day | ORAL | Status: DC
  Administered 2018-03-11 – 2018-03-12 (×2): 10 mg via ORAL
  Filled 2018-03-11 (×2): qty 1

## 2018-03-11 NOTE — Progress Notes (Addendum)
Subjective: Postpartum Day 1: Vaginal delivery, 2nd degree laceration Patient up ad lib, reports no syncope or dizziness. Feeding:  Bottle Contraceptive plan:  Still undecided--may plan abstinence initially  Having issues with seasonal allergies--hx of Claritin use in past, also has cough, minimal phlegm production.  Denies fever, SOB, sore throat, no flu exposures.  Baby still in NICU, but off O2.  Objective: Vital signs in last 24 hours: Temp:  [98.1 F (36.7 C)-98.4 F (36.9 C)] 98.4 F (36.9 C) (04/04 0526) Pulse Rate:  [67-77] 69 (04/04 0526) Resp:  [18-20] 20 (04/04 0526) BP: (112-138)/(56-81) 112/56 (04/04 0526)   Vitals:   03/10/18 0434 03/10/18 0935 03/10/18 1708 03/11/18 0526  BP: 120/70 138/81 123/70 (!) 112/56  Pulse: 78 67 77 69  Resp: 18 18  20   Temp: 98.4 F (36.9 C) 98.3 F (36.8 C) 98.1 F (36.7 C) 98.4 F (36.9 C)  TempSrc: Oral   Oral  SpO2:      Weight:      Height:        Physical Exam:  General: alert  Chest clear Heart RRR without murmur. Lochia: appropriate Uterine Fundus: firm Perineum: healing well DVT Evaluation: No evidence of DVT seen on physical exam. Negative Homan's sign.   CBC Latest Ref Rng & Units 03/11/2018 03/10/2018 03/09/2018  WBC 4.0 - 10.5 K/uL 11.4(H) 23.8(H) 14.5(H)  Hemoglobin 12.0 - 15.0 g/dL 10.2(L) 11.6(L) 12.8  Hematocrit 36.0 - 46.0 % 30.2(L) 33.9(L) 37.3  Platelets 150 - 400 K/uL 194 214 275     Assessment/Plan: Status post vaginal delivery day 1. NICU infant, with TTN Seasonal allergies Resolved leukocytosis Stable Continue current care. Rx Claritin, Robitussin Plan for discharge tomorrow    Nigel BridgemanVicki Kore Madlock CNM 03/11/2018, 7:32 AM

## 2018-03-12 MED ORDER — IBUPROFEN 600 MG PO TABS
600.0000 mg | ORAL_TABLET | Freq: Four times a day (QID) | ORAL | 0 refills | Status: AC
Start: 2018-03-12 — End: ?

## 2018-03-12 MED ORDER — PRENATAL MULTIVITAMIN CH: 1.0000 | ORAL_TABLET | Freq: Every day | ORAL | 3 refills | Status: AC

## 2018-03-12 MED ORDER — NICOTINE 21 MG/24HR TD PT24: 21.0000 mg | MEDICATED_PATCH | Freq: Every day | TRANSDERMAL | 0 refills | Status: AC

## 2018-03-12 NOTE — Discharge Summary (Signed)
    OB Discharge Summary     Patient Name: Teresa ItoRachel H Scott DOB: 06-Sep-1991 MRN: [id number]  Date of admission: 03/09/2018 Delivering MD: Kenney HousemanPROTHERO, NANCY JEAN   Date of discharge: 03/12/2018  Admitting diagnosis: 38 WEEKS LOST MUCUS PLUG CRAMPING Intrauterine pregnancy: 3840w1d     Secondary diagnosis:  Principal Problem:   SVD (spontaneous vaginal delivery) Active Problems:   Pregnancy  Additional problems:gestational hypertension    Discharge diagnosis: term delivered                                                                                               Post partum procedures:none  Augmentation: none  Complications: perineal 2 degree  Hospital course:  uncomplicated  Physical exam  Vitals:   03/10/18 1708 03/11/18 0526 03/11/18 1808 03/12/18 0654  BP: 123/70 (!) 112/56 134/67 124/74  Pulse: 77 69 93 70  Resp:  20 18 18   Temp: 98.1 F (36.7 C) 98.4 F (36.9 C) 98.4 F (36.9 C) 97.8 F (36.6 C)  TempSrc:  Oral Oral Oral  SpO2:      Weight:      Height:       General: alert oriented x3 Lochia: appropriate Uterine Fundus:firm Incision: na DVT Evaluation: no s/sx of dvt Labs: Lab Results  Component Value Date   WBC 11.4 (H) 03/11/2018   HGB 10.2 (L) 03/11/2018   HCT 30.2 (L) 03/11/2018   MCV 88.0 03/11/2018   PLT 194 03/11/2018   CMP Latest Ref Rng & Units 03/09/2018  Glucose 65 - 99 mg/dL 99  BUN 6 - 20 mg/dL 6  Creatinine 4.090.44 - 8.111.00 mg/dL 9.140.53  Sodium 782135 - 956145 mmol/L 133(L)  Potassium 3.5 - 5.1 mmol/L 3.7  Chloride 101 - 111 mmol/L 102  CO2 22 - 32 mmol/L 18(L)  Calcium 8.9 - 10.3 mg/dL 9.0  Total Protein 6.5 - 8.1 g/dL 8.0  Total Bilirubin 0.3 - 1.2 mg/dL 1.1  Alkaline Phos 38 - 126 U/L 207(H)  AST 15 - 41 U/L 20  ALT 14 - 54 U/L 16    Discharge instruction: per After Visit Summary and "Baby and Me Booklet".  After visit meds: Ibuprofen   Diet: regular  Activity: Advance as tolerated. Pelvic rest for 6 weeks.   Outpatient follow OZ:HYQMup:Call  office and schedule appointment for Monday for B/P check  Postpartum contraception: undecided  Newborn Data: Live born female  Birth Weight: 6 lb 8.1 oz (2950 g) APGAR: 8, 8  Newborn Delivery   Birth date/time:  03/10/2018 00:51:00 Delivery type:  Vaginal, Spontaneous     Baby Feeding: formula Disposition:NICU   03/12/2018 Rhea PinkLori A Clemmons, CNM

## 2018-03-12 NOTE — Discharge Instructions (Signed)

## 2018-03-17 ENCOUNTER — Encounter (HOSPITAL_COMMUNITY): Payer: Self-pay | Admitting: *Deleted

## 2019-12-25 ENCOUNTER — Emergency Department (HOSPITAL_COMMUNITY)
Admission: EM | Admit: 2019-12-25 | Discharge: 2019-12-25 | Disposition: A | Discharge status: Home / Self Care | Payer: Medicaid Other | Attending: Emergency Medicine | Admitting: Emergency Medicine

## 2019-12-25 ENCOUNTER — Other Ambulatory Visit: Payer: Self-pay

## 2019-12-25 ENCOUNTER — Emergency Department (HOSPITAL_COMMUNITY): Payer: Medicaid Other

## 2019-12-25 ENCOUNTER — Encounter (HOSPITAL_COMMUNITY): Payer: Self-pay | Admitting: *Deleted

## 2019-12-25 DIAGNOSIS — M791 Myalgia, unspecified site: Secondary | ICD-10-CM | POA: Insufficient documentation

## 2019-12-25 DIAGNOSIS — R0981 Nasal congestion: Secondary | ICD-10-CM | POA: Insufficient documentation

## 2019-12-25 DIAGNOSIS — R059 Cough, unspecified: Secondary | ICD-10-CM

## 2019-12-25 DIAGNOSIS — Z79899 Other long term (current) drug therapy: Secondary | ICD-10-CM | POA: Diagnosis not present

## 2019-12-25 DIAGNOSIS — R05 Cough: Secondary | ICD-10-CM | POA: Insufficient documentation

## 2019-12-25 DIAGNOSIS — R5383 Other fatigue: Secondary | ICD-10-CM | POA: Insufficient documentation

## 2019-12-25 DIAGNOSIS — R0600 Dyspnea, unspecified: Secondary | ICD-10-CM | POA: Diagnosis not present

## 2019-12-25 DIAGNOSIS — R110 Nausea: Secondary | ICD-10-CM | POA: Diagnosis not present

## 2019-12-25 DIAGNOSIS — R062 Wheezing: Secondary | ICD-10-CM | POA: Diagnosis not present

## 2019-12-25 DIAGNOSIS — Z20822 Contact with and (suspected) exposure to covid-19: Secondary | ICD-10-CM | POA: Insufficient documentation

## 2019-12-25 DIAGNOSIS — F17210 Nicotine dependence, cigarettes, uncomplicated: Secondary | ICD-10-CM | POA: Diagnosis not present

## 2019-12-25 DIAGNOSIS — R509 Fever, unspecified: Secondary | ICD-10-CM | POA: Diagnosis not present

## 2019-12-25 DIAGNOSIS — J3489 Other specified disorders of nose and nasal sinuses: Secondary | ICD-10-CM | POA: Insufficient documentation

## 2019-12-25 LAB — POC SARS CORONAVIRUS 2 AG -  ED: SARS Coronavirus 2 Ag: NEGATIVE

## 2019-12-25 MED ORDER — ALBUTEROL SULFATE HFA 108 (90 BASE) MCG/ACT IN AERS
4.0000 | INHALATION_SPRAY | Freq: Once | RESPIRATORY_TRACT | Status: AC
  Administered 2019-12-25: 21:00:00 4 via RESPIRATORY_TRACT
  Filled 2019-12-25: qty 6.7

## 2019-12-25 MED ORDER — PREDNISONE 20 MG PO TABS
60.0000 mg | ORAL_TABLET | Freq: Once | ORAL | Status: AC
  Administered 2019-12-25: 21:00:00 60 mg via ORAL
  Filled 2019-12-25: qty 3

## 2019-12-25 MED ORDER — PREDNISONE 10 MG PO TABS: 40.0000 mg | ORAL_TABLET | Freq: Every day | ORAL | 0 refills | Status: AC

## 2019-12-25 MED ORDER — AMOXICILLIN 500 MG PO CAPS: 1000.0000 mg | ORAL_CAPSULE | Freq: Three times a day (TID) | ORAL | 0 refills | Status: AC

## 2019-12-25 MED ORDER — FLUTICASONE PROPIONATE 50 MCG/ACT NA SUSP: 2.0000 | Freq: Every day | NASAL | 0 refills | Status: AC

## 2019-12-25 NOTE — ED Triage Notes (Signed)
The pt is c/o a head cold sore throat from coughing no energy low grade temp once last night

## 2019-12-25 NOTE — Discharge Instructions (Signed)
Please take all of your antibiotics until finished!   Take your antibiotics with food.  Common side effects of antibiotics include nausea, vomiting, abdominal discomfort, and diarrhea. You may help offset some of this with probiotics which you can buy or get in yogurt. Do not eat  or take the probiotics until 2 hours after your antibiotic.    Some studies suggest that certain antibiotics can reduce the efficacy of certain oral contraceptive pills (birth control), so please use additional contraceptives (condoms or other barrier method) while you are taking the antibiotics and for an additional 5 to 7 days afterwards if you are a female on these medications.  Use albuterol inhaler 1 to 2 puffs every 4-6 hours as needed for shortness of breath.  Take prednisone beginning tomorrow as you received the first dose in the emergency department today.  Use Flonase nasal spray for nasal congestion.  Drink plenty of fluids and get plenty of rest.  You can take 1 to 2 tablets of Tylenol every 6 hours as needed for fever or pain.  Your Covid test will result within 24 hours.  You will receive a phone call if your test is positive, no phone call if your test is negative  Follow-up with your primary care provider for reevaluation of symptoms.  Return to the emergency department if any concerning signs or symptoms develop such as severe shortness of breath, persistent vomiting, chest pains, or loss of consciousness

## 2019-12-25 NOTE — ED Provider Notes (Signed)
[facility] EMERGENCY DEPARTMENT Provider Note   CSN: [id number] Arrival date & time: 12/25/19  1940     History Chief Complaint  Patient presents with  . Cough    Teresa Scott is a 29 y.o. female with history of anemia evaluation of acute onset, persistent subjective fevers and chills, myalgias, fatigue, sinus pressure for 4 days.  No known sick contacts.  She notes some dyspnea with significant exertion but no dyspnea with ambulation.  Has had nausea but no vomiting, no abdominal pain.  Tolerating p.o. food and fluids without difficulty but has not been eating or drinking as much.  Has not tried anything for her symptoms.  She is a current smoker of approximately 3 cigarettes daily but has not smoked cigarettes while she has been sick.  The history is provided by the patient.       Past Medical History:  Diagnosis Date  . Anemia   . Heart murmur   . Pneumonia    "couple years ago"    Patient Active Problem List   Diagnosis Date Noted  . SVD (spontaneous vaginal delivery) 03/10/2018  . Pregnancy 03/09/2018    Past Surgical History:  Procedure Laterality Date  . HAND SURGERY       OB History    Gravida  1   Para  1   Term  1   Preterm      AB      Living  1     SAB      TAB      Ectopic      Multiple  0   Live Births  1           No family history on file.  Social History   Tobacco Use  . Smoking status: Current Every Day Smoker    Packs/day: 0.25  . Smokeless tobacco: Never Used  Substance Use Topics  . Alcohol use: Not Currently  . Drug use: Not Currently    Types: Marijuana    Comment: stopped with pregnancy    Home Medications Prior to Admission medications   Medication Sig Start Date End Date Taking? Authorizing Provider  amoxicillin (AMOXIL) 500 MG capsule Take 2 capsules (1,000 mg total) by mouth 3 (three) times daily for 7 days. 12/25/19 01/01/20  Michela Pitcher A, PA-C  flintstones complete (FLINTSTONES) 60  MG chewable tablet Chew 2 tablets by mouth daily.    [provider]  fluticasone (FLONASE) 50 MCG/ACT nasal spray Place 2 sprays into both nostrils daily. 12/25/19   Evelynn Hench A, PA-C  ibuprofen (ADVIL,MOTRIN) 600 MG tablet Take 1 tablet (600 mg total) by mouth every 6 (six) hours. 03/12/18   Clemmons, Elmore Guise, CNM  nicotine (NICODERM CQ - DOSED IN MG/24 HOURS) 21 mg/24hr patch Place 1 patch (21 mg total) onto the skin daily. 03/12/18   Jaymes Graff, MD  predniSONE (DELTASONE) 10 MG tablet Take 4 tablets (40 mg total) by mouth daily with breakfast for 4 days. 12/25/19 12/29/19  Michela Pitcher A, PA-C  Prenatal Vit-Fe Fumarate-FA (PRENATAL MULTIVITAMIN) TABS tablet Take 1 tablet by mouth daily at 12 noon. 03/12/18   Jaymes Graff, MD    Allergies    Sulfa antibiotics  Review of Systems   Review of Systems  Constitutional: Positive for chills and fever.  HENT: Positive for congestion, sinus pressure and sinus pain.   Respiratory: Positive for cough and shortness of breath.   Cardiovascular: Negative for chest pain.  Gastrointestinal: Positive for nausea. Negative for abdominal pain and vomiting.  All other systems reviewed and are negative.   Physical Exam Updated Vital Signs BP (!) 144/98 (BP Location: Right Arm)   Pulse 91   Temp 98 F (36.7 C) (Oral)   Resp 20   Ht 5\' 6"  (1.676 m)   Wt 74.8 kg   SpO2 99%   BMI 26.63 kg/m   Physical Exam Vitals and nursing note reviewed.  Constitutional:      General: She is not in acute distress.    Appearance: She is well-developed.  HENT:     Head: Normocephalic and atraumatic.     Nose: Congestion present.     Comments: Sounds audibly congested Eyes:     General:        Right eye: No discharge.        Left eye: No discharge.     Conjunctiva/sclera: Conjunctivae normal.  Neck:     Vascular: No JVD.     Trachea: No tracheal deviation.  Cardiovascular:     Rate and Rhythm: Regular rhythm. Tachycardia present.  Pulmonary:      Effort: Pulmonary effort is normal.     Breath sounds: Wheezing present.     Comments: Diffuse expiratory wheezing, worse in lung bases.  Speaking in full sentences without difficulty, SPO2 saturations 100% on room air and with ambulation Abdominal:     General: There is no distension.  Musculoskeletal:     Cervical back: Normal range of motion and neck supple.  Skin:    General: Skin is warm and dry.     Findings: No erythema.  Neurological:     Mental Status: She is alert.  Psychiatric:        Behavior: Behavior normal.     ED Results / Procedures / Treatments   Labs (all labs ordered are listed, but only abnormal results are displayed) Labs Reviewed  NOVEL CORONAVIRUS, NAA (HOSP ORDER, SEND-OUT TO REF LAB; TAT 18-24 HRS)  POC SARS CORONAVIRUS 2 AG -  ED    EKG None  Radiology DG Chest Portable 1 View  Result Date: 12/25/2019 CLINICAL DATA:  Sore throat and cough. EXAM: PORTABLE CHEST 1 VIEW COMPARISON:  05/25/2018 FINDINGS: Heart size is normal. Mediastinal shadows are normal. Question mild patchy infiltrate in the lingula or left lower lobe. Otherwise the chest is clear. No effusions. Mild spinal curvature. IMPRESSION: No active disease versus small patchy infiltrate in the lingula or left lower lobe. Electronically Signed   By: 05/27/2018 M.D.   On: 12/25/2019 21:25    Procedures Procedures (including critical care time)  Medications Ordered in ED Medications  albuterol (VENTOLIN HFA) 108 (90 Base) MCG/ACT inhaler 4 puff (4 puffs Inhalation Given 12/25/19 2050)  predniSONE (DELTASONE) tablet 60 mg (60 mg Oral Given 12/25/19 2049)    ED Course  I have reviewed the triage vital signs and the nursing notes.  Pertinent labs & imaging results that were available during my care of the patient were reviewed by me and considered in my medical decision making (see chart for details).    MDM Rules/Calculators/A&P                      BRENTLEE SCIARA was evaluated in  Emergency Department on 12/25/2019 for the symptoms described in the history of present illness. She was evaluated in the context of the global COVID-19 pandemic, which necessitated consideration that the patient might be at risk  for infection with the SARS-CoV-2 virus that causes COVID-19. Institutional protocols and algorithms that pertain to the evaluation of patients at risk for COVID-19 are in a state of rapid change based on information released by regulatory bodies including the CDC and federal and state organizations. These policies and algorithms were followed during the patient's care in the ED.  Patient with subjective fevers chills, cough, myalgia, nasal congestion for 4 days presents for evaluation.  She is afebrile in the ED, initially mildly tachycardic on my assessment but improvement in heart rate on subsequent reevaluation and vital signs are otherwise stable.  She is nontoxic in appearance.  Speaking in full sentences without difficulty and SPO2 saturations are stable in the ED at rest and with ambulation.  She has diffuse wheezes on auscultation of the lungs, no history of asthma but she is a smoker.  Will give prednisone and albuterol in the ED.  Unable to give nebulizer treatments in the setting of the COVID-19 pandemic.  Point-of-care Covid swab was negative so we will obtain outpatient Covid test.  Radiographs show possible small patchy infiltrate in the lingula or left lower lobe.  We will cover for community-acquired pneumonia with amoxicillin.  On reevaluation patient resting comfortably reports she is feeling better.  She feels comfortable with discharge home.  O2 saturations have been stable while in the ED.  Discussed symptomatic management as well and current CDC recommendations for quarantining at home.  Recommend follow-up with PCP for reevaluation of symptoms.  Also encouraged patient to purchase a pulse oximeter to check oxygen levels at home.  Discussed strict ED return  precautions. Patient verbalized understanding of and agreement with plan and is safe for discharge home at this time.  Final Clinical Impression(s) / ED Diagnoses Final diagnoses:  Cough  Sinus pressure    Rx / DC Orders ED Discharge Orders         Ordered    amoxicillin (AMOXIL) 500 MG capsule  3 times daily     12/25/19 2227    predniSONE (DELTASONE) 10 MG tablet  Daily with breakfast     12/25/19 2227    fluticasone (FLONASE) 50 MCG/ACT nasal spray  Daily     12/25/19 2227           Debroah Baller 12/25/19 2234    Tegeler, Gwenyth Allegra, MD 12/26/19 [phone]

## 2019-12-27 LAB — NOVEL CORONAVIRUS, NAA (HOSP ORDER, SEND-OUT TO REF LAB; TAT 18-24 HRS): SARS-CoV-2, NAA: NOT DETECTED

## 2021-04-15 IMAGING — DX DG CHEST 1V PORT
1 series · 1 of 1 positions shown · non-contrast
Comparison: 05/25/2018

CLINICAL DATA: Sore throat and cough.

EXAM:
PORTABLE CHEST 1 VIEW

[chest ap]
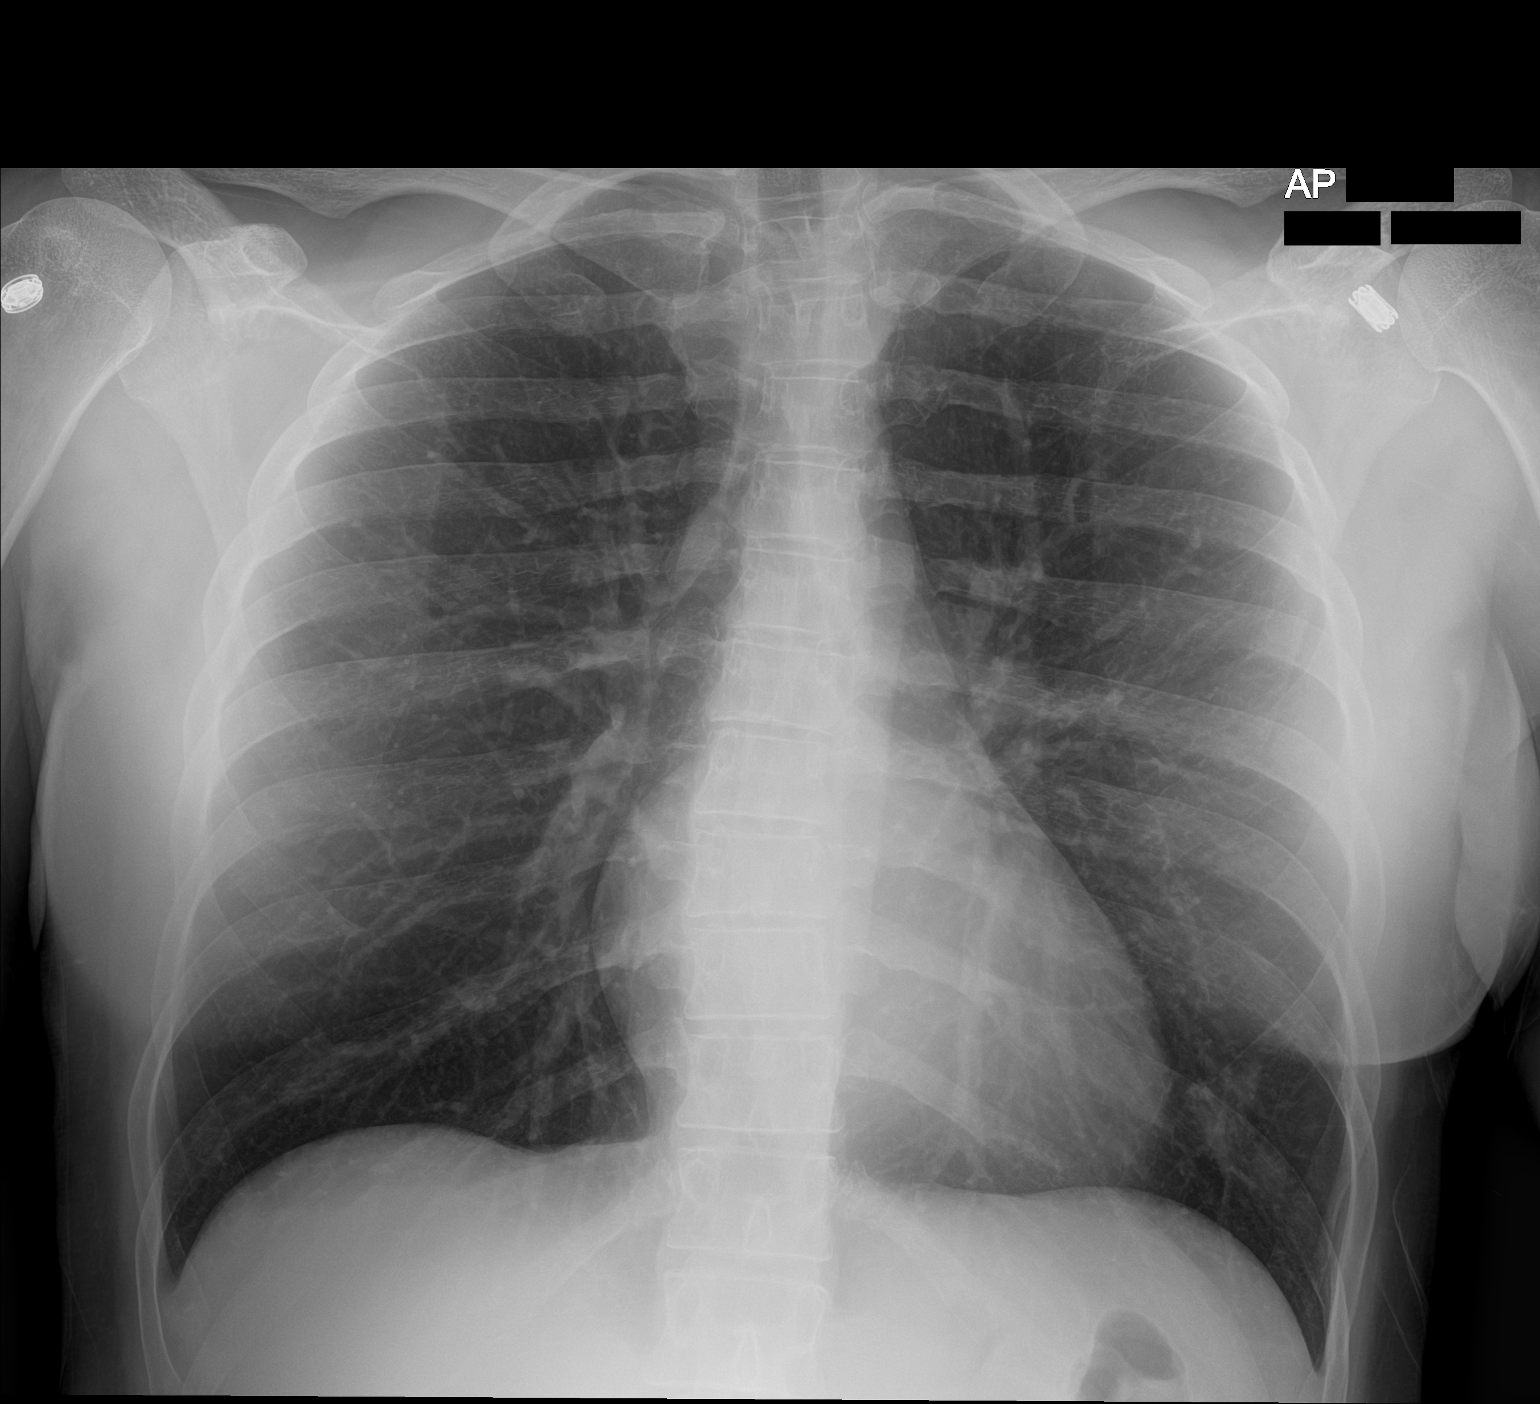

[1 of 1 positions shown; findings below may reference images not displayed]

FINDINGS: Heart size is normal. Mediastinal shadows are normal. Question mild
patchy infiltrate in the lingula or left lower lobe. Otherwise the
chest is clear. No effusions. Mild spinal curvature.
IMPRESSION: No active disease versus small patchy infiltrate in the lingula or
left lower lobe.

## 2023-02-15 ENCOUNTER — Emergency Department (HOSPITAL_BASED_OUTPATIENT_CLINIC_OR_DEPARTMENT_OTHER): Payer: Medicaid Other

## 2023-02-15 ENCOUNTER — Emergency Department (HOSPITAL_BASED_OUTPATIENT_CLINIC_OR_DEPARTMENT_OTHER)
Admission: EM | Admit: 2023-02-15 | Discharge: 2023-02-15 | Disposition: A | Discharge status: Home / Self Care | Payer: Medicaid Other | Attending: Emergency Medicine | Admitting: Emergency Medicine

## 2023-02-15 ENCOUNTER — Other Ambulatory Visit: Payer: Self-pay

## 2023-02-15 DIAGNOSIS — Y92331 Roller skating rink as the place of occurrence of the external cause: Secondary | ICD-10-CM | POA: Insufficient documentation

## 2023-02-15 DIAGNOSIS — S82251A Displaced comminuted fracture of shaft of right tibia, initial encounter for closed fracture: Secondary | ICD-10-CM | POA: Diagnosis not present

## 2023-02-15 DIAGNOSIS — S82451A Displaced comminuted fracture of shaft of right fibula, initial encounter for closed fracture: Secondary | ICD-10-CM

## 2023-02-15 DIAGNOSIS — S8991XA Unspecified injury of right lower leg, initial encounter: Secondary | ICD-10-CM | POA: Diagnosis present

## 2023-02-15 DIAGNOSIS — W010XXA Fall on same level from slipping, tripping and stumbling without subsequent striking against object, initial encounter: Secondary | ICD-10-CM | POA: Insufficient documentation

## 2023-02-15 DIAGNOSIS — Y9351 Activity, roller skating (inline) and skateboarding: Secondary | ICD-10-CM | POA: Insufficient documentation

## 2023-02-15 LAB — HCG, SERUM, QUALITATIVE: Preg, Serum: NEGATIVE

## 2023-02-15 MED ORDER — HYDROCODONE-ACETAMINOPHEN 5-325 MG PO TABS: 1.0000 | ORAL_TABLET | Freq: Four times a day (QID) | ORAL | 0 refills | Status: AC | PRN

## 2023-02-15 MED ORDER — MORPHINE SULFATE (PF) 4 MG/ML IV SOLN
4.0000 mg | Freq: Once | INTRAVENOUS | Status: AC
  Administered 2023-02-15: 4 mg via INTRAVENOUS
  Filled 2023-02-15: qty 1

## 2023-02-15 MED ORDER — FENTANYL CITRATE PF 50 MCG/ML IJ SOSY
50.0000 ug | PREFILLED_SYRINGE | INTRAMUSCULAR | Status: DC | PRN
  Administered 2023-02-15: 50 ug via INTRAVENOUS
  Filled 2023-02-15: qty 1

## 2023-02-15 MED ORDER — ONDANSETRON HCL 4 MG/2ML IJ SOLN
4.0000 mg | Freq: Once | INTRAMUSCULAR | Status: AC
  Administered 2023-02-15: 4 mg via INTRAVENOUS
  Filled 2023-02-15: qty 2

## 2023-02-15 NOTE — Discharge Instructions (Addendum)
Thank you for allowing me to be part of your care today.  You were placed in a splint due to having 2 broken bones in your right leg.  Please be sure to keep this splint dry and do not bear weight on your leg.    Call the [facility] health orthopedics office tomorrow to schedule a follow-up appointment later this week.  I have sent a pain medicine over to your pharmacy.  You may also use 800 mg of ibuprofen every 6-8 hours as needed in addition to this pain medicine.

## 2023-02-15 NOTE — ED Notes (Signed)
Pt had a pillow splint applied to the RLE in a position of comfort. Distal PMS intact. No neurological deficits.

## 2023-02-15 NOTE — ED Notes (Signed)
XR at bedside

## 2023-02-15 NOTE — ED Notes (Signed)
Patient noted inside of the car to have a present distal pedal pulse in the affected extremity. Same was marked.

## 2023-02-15 NOTE — ED Notes (Signed)
Ankle stabilized, ice packs applied.

## 2023-02-15 NOTE — ED Provider Notes (Signed)
[facility] Provider Note   CSN: [id number] Arrival date & time: 02/15/23  1653     History  Chief Complaint  Patient presents with   Leg Injury    Teresa Scott is a 32 y.o. female presents to the ED complaining of a right lower leg injury that happened while she was rollerskating.  Patient states that she was at a birthday party and skating on plain roller skates when her foot went out from under her going behind her and causing her ankle and leg to twist.  She did fall to the ground following the injury, but did not hit her head and was able to catch herself.  Patient complaining of severe lower right leg pain and is concerned her leg is broken.  She states that she has a deformity and her leg is very swollen.  Denies any other injury related to the incident.  Denies loss of consciousness, back pain, neck pain, numbness or tingling in the extremity, weakness.      Home Medications Prior to Admission medications   Medication Sig Start Date End Date Taking? Authorizing Provider  HYDROcodone-acetaminophen (NORCO/VICODIN) 5-325 MG tablet Take 1-2 tablets by mouth every 6 (six) hours as needed. 02/15/23  Yes Kiani Wurtzel R, PA  flintstones complete (FLINTSTONES) 60 MG chewable tablet Chew 2 tablets by mouth daily.    [provider]  fluticasone (FLONASE) 50 MCG/ACT nasal spray Place 2 sprays into both nostrils daily. 12/25/19   Fawze, Mina A, PA-C  ibuprofen (ADVIL,MOTRIN) 600 MG tablet Take 1 tablet (600 mg total) by mouth every 6 (six) hours. 03/12/18   Clemmons, Bertram Gala, CNM  nicotine (NICODERM CQ - DOSED IN MG/24 HOURS) 21 mg/24hr patch Place 1 patch (21 mg total) onto the skin daily. 03/12/18   Crawford Givens, MD  Prenatal Vit-Fe Fumarate-FA (PRENATAL MULTIVITAMIN) TABS tablet Take 1 tablet by mouth daily at 12 noon. 03/12/18   Crawford Givens, MD      Allergies    Sulfa antibiotics    Review of Systems   Review of Systems   Musculoskeletal:  Negative for back pain and neck pain.       Right lower leg pain with deformity and swelling  Skin:  Negative for color change and wound.  Neurological:  Negative for syncope, weakness and numbness.    Physical Exam Updated Vital Signs BP (!) 114/54   Pulse 76   Temp 97.8 F (36.6 C) (Oral)   Resp 18   Wt 74.8 kg   SpO2 98%   BMI 26.63 kg/m  Physical Exam Vitals and nursing note reviewed.  Constitutional:      General: She is not in acute distress.    Appearance: Normal appearance. She is not ill-appearing or diaphoretic.  HENT:     Head: Normocephalic and atraumatic.  Cardiovascular:     Rate and Rhythm: Normal rate and regular rhythm.  Pulmonary:     Effort: Pulmonary effort is normal.  Musculoskeletal:     Cervical back: Full passive range of motion without pain.     Right lower leg: Swelling, deformity, tenderness and bony tenderness present.     Right ankle: No swelling or deformity. No tenderness.     Right foot: No swelling or deformity.     Comments: Patient with swelling to the right lower extremity proximal to the ankle.  There is also exquisite tenderness over this area and deformity.  DP pulses 2+.  No  obvious injury to the right knee.  No swelling or deformity to the ankle or right foot.  Neurological:     Mental Status: She is alert. Mental status is at baseline.  Psychiatric:        Mood and Affect: Mood normal.        Behavior: Behavior normal.     ED Results / Procedures / Treatments   Labs (all labs ordered are listed, but only abnormal results are displayed) Labs Reviewed  HCG, SERUM, QUALITATIVE    EKG None  Radiology DG Tibia/Fibula Right  Result Date: 02/15/2023 CLINICAL DATA:  Leg pain EXAM: RIGHT TIBIA AND FIBULA - 2 VIEW COMPARISON:  None Available. FINDINGS: There is an acute oblique fracture of the distal tibial diaphysis which appears comminuted. The distal fracture fragment is displaced anteriorly 15 mm. There is  surrounding soft tissue swelling. IMPRESSION: Acute comminuted and displaced fracture of the distal tibial diaphysis. Electronically Signed   By: Ronney Asters M.D.   On: 02/15/2023 17:29   DG Ankle Complete Right  Result Date: 02/15/2023 CLINICAL DATA:  Right leg injury with deformity EXAM: RIGHT ANKLE - COMPLETE 3+ VIEW COMPARISON:  None Available. FINDINGS: Acute comminuted fractures of the distal right tibial and fibular metadiaphyses. The fibular fracture is mildly displaced in a posteromedial direction. The tibial fracture is mildly displaced in a anterolateral direction. No significant angulation. The fibular fracture could potentially extend intra-articularly to the ankle joint. The ankle mortise is congruent without dislocation. Soft tissue swelling at the fracture sites. IMPRESSION: Acute comminuted fractures of the distal right tibial and fibular metadiaphyses. Electronically Signed   By: Davina Poke D.O.   On: 02/15/2023 17:26    Procedures Procedures    Medications Ordered in ED Medications  fentaNYL (SUBLIMAZE) injection 50 mcg (50 mcg Intravenous Given 02/15/23 1705)  ondansetron (ZOFRAN) injection 4 mg (4 mg Intravenous Given 02/15/23 1834)  morphine (PF) 4 MG/ML injection 4 mg (4 mg Intravenous Given 02/15/23 1834)    ED Course/ Medical Decision Making/ A&P                             Medical Decision Making Amount and/or Complexity of Data Reviewed Labs: ordered. Radiology: ordered.  Risk Prescription drug management.   This patient presents to the ED with chief complaint(s) of right lower leg injury.  The complaint involves an extensive differential diagnosis and also carries with it a high risk of complications and morbidity.    The differential diagnosis includes acute fracture to tibia and/or fibula, ankle dislocation, ankle fracture, contusion  The initial plan is to obtain x-rays of right lower leg and ankle  Initial Assessment:   Exam significant for  swelling to the right lower extremity proximal to the ankle.  There is no obvious swelling or deformity to the ankle or right foot.  DP pulse is 2+.  No obvious injury to the right knee.  She does have deformity over the distal right lower extremity.  There is also exquisite tenderness in this area.  Independent ECG/labs interpretation:  The following labs were independently interpreted:  Pregnancy negative  Independent visualization and interpretation of imaging: I independently visualized the following imaging with scope of interpretation limited to determining acute life threatening conditions related to emergency care: X-rays of tib-fib and right ankle, which revealed distal fracture to both tibia and fibula without dislocation of the ankle.  These fractures are comminuted and displaced.  I  agree with radiologist interpretation.  Treatment and Reassessment: Patient was given pain medicine prior to splint placement.  Fracture was reduced and splint was placed.  Patient educated on splint care.  Discussed with her that she will need to follow-up with orthopedics later this week and referral information was provided.  Disposition:   The patient has been appropriately medically screened and/or stabilized in the ED. I have low suspicion for any other emergent medical condition which would require further screening, evaluation or treatment in the ED or require inpatient management. At time of discharge the patient is hemodynamically stable and in no acute distress. I have discussed work-up results and diagnosis with patient and answered all questions. Patient is agreeable with discharge plan. We discussed strict return precautions for returning to the emergency department and they verbalized understanding.            Final Clinical Impression(s) / ED Diagnoses Final diagnoses:  Closed displaced comminuted fracture of shaft of right tibia, initial encounter  Closed displaced comminuted  fracture of shaft of right fibula, initial encounter    Rx / DC Orders ED Discharge Orders          Ordered    Crutches        02/15/23 1918    HYDROcodone-acetaminophen (NORCO/VICODIN) 5-325 MG tablet  Every 6 hours PRN        02/15/23 1924              Pat Kocher, PA 02/15/23 1924    Lorelle Gibbs, DO 02/15/23 2123

## 2023-02-15 NOTE — ED Triage Notes (Signed)
Pt arrives with c/o lower right leg injury that happened while she was roller skating. Obvious deformity noted.
